# Patient Record
Sex: Female | Born: 1995 | Race: White | Hispanic: No | Marital: Single | Smoking: Never smoker
Health system: Southern US, Community
[De-identification: ages and names within clinical notes are randomized; demographics above are authoritative.]

## PROBLEM LIST (undated history)

## (undated) ENCOUNTER — Inpatient Hospital Stay (HOSPITAL_COMMUNITY): Payer: Self-pay

## (undated) DIAGNOSIS — J45909 Unspecified asthma, uncomplicated: Secondary | ICD-10-CM

## (undated) DIAGNOSIS — M419 Scoliosis, unspecified: Secondary | ICD-10-CM

---

## 2017-07-20 ENCOUNTER — Emergency Department (HOSPITAL_BASED_OUTPATIENT_CLINIC_OR_DEPARTMENT_OTHER)
Admission: EM | Admit: 2017-07-20 | Discharge: 2017-07-20 | Disposition: A | Discharge status: Home / Self Care | Payer: Medicaid - Out of State | Attending: Emergency Medicine | Admitting: Emergency Medicine

## 2017-07-20 ENCOUNTER — Encounter (HOSPITAL_BASED_OUTPATIENT_CLINIC_OR_DEPARTMENT_OTHER): Payer: Self-pay | Admitting: *Deleted

## 2017-07-20 ENCOUNTER — Emergency Department (HOSPITAL_BASED_OUTPATIENT_CLINIC_OR_DEPARTMENT_OTHER): Payer: Medicaid - Out of State

## 2017-07-20 DIAGNOSIS — J45909 Unspecified asthma, uncomplicated: Secondary | ICD-10-CM | POA: Diagnosis not present

## 2017-07-20 DIAGNOSIS — R110 Nausea: Secondary | ICD-10-CM | POA: Diagnosis not present

## 2017-07-20 DIAGNOSIS — Z3A01 Less than 8 weeks gestation of pregnancy: Secondary | ICD-10-CM | POA: Insufficient documentation

## 2017-07-20 DIAGNOSIS — Z3201 Encounter for pregnancy test, result positive: Secondary | ICD-10-CM | POA: Diagnosis not present

## 2017-07-20 DIAGNOSIS — O209 Hemorrhage in early pregnancy, unspecified: Secondary | ICD-10-CM | POA: Diagnosis not present

## 2017-07-20 DIAGNOSIS — O219 Vomiting of pregnancy, unspecified: Secondary | ICD-10-CM

## 2017-07-20 DIAGNOSIS — O9989 Other specified diseases and conditions complicating pregnancy, childbirth and the puerperium: Secondary | ICD-10-CM | POA: Diagnosis present

## 2017-07-20 HISTORY — DX: Unspecified asthma, uncomplicated: J45.909

## 2017-07-20 HISTORY — DX: Scoliosis, unspecified: M41.9

## 2017-07-20 LAB — URINALYSIS, ROUTINE W REFLEX MICROSCOPIC
Bilirubin Urine: NEGATIVE
GLUCOSE, UA: NEGATIVE mg/dL
Ketones, ur: 15 mg/dL — AB
Nitrite: NEGATIVE
PH: 8.5 — AB (ref 5.0–8.0)
Protein, ur: NEGATIVE mg/dL
SPECIFIC GRAVITY, URINE: 1.015 (ref 1.005–1.030)

## 2017-07-20 LAB — CBC
HCT: 41.1 % (ref 36.0–46.0)
Hemoglobin: 14.1 g/dL (ref 12.0–15.0)
MCH: 30 pg (ref 26.0–34.0)
MCHC: 34.3 g/dL (ref 30.0–36.0)
MCV: 87.4 fL (ref 78.0–100.0)
Platelets: 199 10*3/uL (ref 150–400)
RBC: 4.7 MIL/uL (ref 3.87–5.11)
RDW: 12.9 % (ref 11.5–15.5)
WBC: 8.3 10*3/uL (ref 4.0–10.5)

## 2017-07-20 LAB — URINALYSIS, MICROSCOPIC (REFLEX)

## 2017-07-20 LAB — PREGNANCY, URINE: Preg Test, Ur: POSITIVE — AB

## 2017-07-20 LAB — HCG, QUANTITATIVE, PREGNANCY: HCG, BETA CHAIN, QUANT, S: 7635 m[IU]/mL — AB (ref <5)

## 2017-07-20 MED ORDER — DOXYLAMINE SUCCINATE (SLEEP) 25 MG PO TABS: 25.0000 mg | ORAL_TABLET | Freq: Every evening | ORAL | 0 refills | Status: AC | PRN

## 2017-07-20 MED ORDER — VITAMIN B-6 25 MG PO TABS: 25.0000 mg | ORAL_TABLET | Freq: Every day | ORAL | 0 refills | Status: AC

## 2017-07-20 NOTE — ED Notes (Signed)
ED Provider at bedside. 

## 2017-07-20 NOTE — ED Provider Notes (Signed)
[facility] Provider [facility]   CSN: [id number] Arrival date & time: 07/20/17  1122     History   Chief Complaint Chief Complaint  Patient presents with  . Nausea    HPI Jody Martinez is a 21 y.o. female.  HPI   Patient is a 21 year old female with a history of asthma, scoliosis, and possible endometriosis (no tissue sample) presenting for nausea without vomiting in the setting of a positive pregnancy test at home. Patient wanted to be evaluated to ensure that she is pregnant and see if any further testing needed to be done as patient just moved to [place]. This is a planned pregnancy. Patient reports that she is nauseous throughout the day and it is exacerbated by certain foods. Patient reports she is not had any other symptoms of gastroenteritis such as vomiting or diarrhea. Patient denies fever, chills, lower abdominal pain, flank pain, dysuria, or urgency. Patient does report some spotting yesterday and today, with darker spots today. No clots. Patient has never been pregnant to her knowledge, has never had a miscarriage, has never had an ectopic pregnancy, and has never had an STI.   Past Medical History:  Diagnosis Date  . Asthma   . Scoliosis     There are no active problems to display for this patient.   History reviewed. No pertinent surgical history.  OB History    No data available       Home Medications    Prior to Admission medications   Not on File    Family History No family history on file.  Social History Social History  Substance Use Topics  . Smoking status: Never Smoker  . Smokeless tobacco: Never Used  . Alcohol use No     Allergies   Patient has no known allergies.   Review of Systems Review of Systems  Constitutional: Negative for chills and fever.  HENT: Negative for rhinorrhea.   Eyes: Negative for visual disturbance.  Respiratory: Negative for shortness of breath.   Cardiovascular: Negative for chest pain,  palpitations and leg swelling.  Gastrointestinal: Positive for constipation and nausea. Negative for abdominal pain, diarrhea and vomiting.  Genitourinary: Positive for vaginal bleeding. Negative for dysuria, flank pain and pelvic pain.  Musculoskeletal: Negative for back pain.  Skin: Negative for rash.  Neurological: Negative for dizziness.     Physical Exam Updated Vital Signs BP 121/72 (BP Location: Left Arm)   Pulse 84   Temp 99.3 F (37.4 C) (Oral)   Resp 18   Ht  (1.575 m)   Wt 70.3 kg (155 lb)   LMP 06/04/2017   SpO2 100%   BMI 28.35 kg/m   Physical Exam  Constitutional: She appears well-developed and well-nourished. No distress.  Sitting comfortably in bed.  HENT:  Head: Normocephalic and atraumatic.  Mouth/Throat: Oropharynx is clear and moist.  Eyes: Pupils are equal, round, and reactive to light. Conjunctivae and EOM are normal. Right eye exhibits no discharge. Left eye exhibits no discharge.  EOMs normal to gross examination.  Neck: Normal range of motion. Neck supple.  Cardiovascular: Normal rate, regular rhythm, S1 normal, S2 normal, normal heart sounds and intact distal pulses.   No murmur heard. No lower extremity edema.  Pulmonary/Chest: Effort normal and breath sounds normal. She has no wheezes. She has no rales.  Abdominal: Soft. She exhibits no distension. There is tenderness. There is no guarding.  Tenderness to deep palpation of the left lower quadrant.  Musculoskeletal: Normal range of  motion. She exhibits no edema or deformity.  Lymphadenopathy:    She has no cervical adenopathy.  Neurological: She is alert.  Cranial nerves grossly intact. Pt moves extremities without difficulty.  Skin: Skin is warm and dry. No rash noted. She is not diaphoretic. No erythema.  Psychiatric: She has a normal mood and affect. Her behavior is normal. Judgment and thought content normal.  Nursing note and vitals reviewed.    ED Treatments / Results   Labs (all labs ordered are listed, but only abnormal results are displayed) Labs Reviewed  URINALYSIS, ROUTINE W REFLEX MICROSCOPIC - Abnormal; Notable for the following:       Result Value   pH 8.5 (*)    Hgb urine dipstick LARGE (*)    Ketones, ur 15 (*)    Leukocytes, UA SMALL (*)    All other components within normal limits  PREGNANCY, URINE - Abnormal; Notable for the following:    Preg Test, Ur POSITIVE (*)    All other components within normal limits  URINALYSIS, MICROSCOPIC (REFLEX) - Abnormal; Notable for the following:    Bacteria, UA FEW (*)    Squamous Epithelial / LPF 6-30 (*)    All other components within normal limits    EKG  EKG Interpretation None       Radiology No results found.  Procedures Procedures (including critical care time)  Medications Ordered in ED Medications - No data to display   Initial Impression / Assessment and Plan / ED Course  I have reviewed the triage vital signs and the nursing notes.  Pertinent labs & imaging results that were available during my care of the patient were reviewed by me and considered in my medical decision making (see chart for details).  Clinical Course as of Jul 21 1223  Mon Jul 20, 2017  1222 Patient seen and evaluated. Discussed with patient the need to rule out possible ectopic pregnancy. Patient is in understanding and agreement with plan of care.  [AM]  1222 Patient case discussed with Dr. Azalia Bilis who is in agreement with workup.  [AM]    Clinical Course User Index [AM] Elisha Ponder, PA-C     Final Clinical Impressions(s) / ED Diagnoses   Final diagnoses:  None  MDM  Patient is a 21 year old female with a history of asthma, scoliosis, and possible endometriosis (no tissue sample) presenting for nausea without vomiting in the setting of a positive pregnancy test at home. Reassured patient and positive pregnancy result today. In the setting of vaginal bleeding over the past 48-72  hours, and left lower quadrant tenderness on exam, proceeded with rule out ectopic pregnancy workup. Patient's hCG appropriate for evaluating intrauterine location of pregnancy. Transvaginal ultrasound demonstrates gestational sac in the uterus, however no definable embryo noted. Subchorionic hemorrhage noted on TV US. Explained to patient that these results are difficult to interpret, as her pregnancy is so early. Emphasized follow-up in 72 hours with repeat hCG and vaginal ultrasound, and gave patient resources to follow up with [facility]. Return precautions given for any increasing vaginal bleeding, lower abdominal pain or cramping, or severe nausea or vomiting that affects nutrition. Doxylamine and vitamin B6 prescribed today. Patient is in understanding and agrees with the plan of care.   This is a supervised visit with Dr. Azalia Bilis. Evaluation, management, and discharge planning discussed with this attending physician.  New Prescriptions New Prescriptions   No medications on file     Elisha Ponder, [place]  07/20/17 0347    Azalia Bilis, MD 07/21/17 1606

## 2017-07-20 NOTE — ED Triage Notes (Signed)
Nausea. Missed menses. Had a positive home pregnancy test. Wants to be sure the test is correct.

## 2017-07-20 NOTE — Discharge Instructions (Addendum)
Please see the information and instructions below regarding your visit.  Your diagnoses today include:  1. Nausea/vomiting in pregnancy    You were seen today for an early pregnancy. You are between 5 and [redacted] weeks along in your pregnancy.  Tests performed today include: See side panel of your discharge paperwork for testing performed today. Vital signs are listed at the bottom of these instructions.  -Transvaginal ultrasound -Blood counts -Quantitative beta-hcg (pregnancy hormone)  Medications prescribed:    Take any prescribed medications only as prescribed, and any over the counter medications only as directed on the packaging.  1. Doxylamine/. This is a medication that is a cousin of Benadryl. This medication can make you sleepy, so please take at night to begin. 2. Vitamin B6. You may take this medication every 4-6 hours to help with nausea.  Some women find that injure also helps with nausea and vomiting of early pregnancy.  Home care instructions:  Please follow any educational materials contained in this packet.   Follow-up instructions: Please follow up with [facility] using the number listed in this paperwork so that you can see them in 72 hours for reevaluation of your hormones and ultrasound.  Return instructions:  Please return to the Emergency Department if you experience worsening symptoms.  Please return for any increased vaginal bleeding such that you are bleeding through 1 pad an hour for at least 6 hours, nausea and vomiting that prevents she from keeping anything down, lower belly to lower back pain, or any other concerns about your pregnancy. Please return if you have any other emergent concerns.   Your vital signs today were: BP 114/72 (BP Location: Right Arm)    Pulse 93    Temp 99.3 F (37.4 C) (Oral)    Resp 18    Ht  (1.575 m)    Wt 70.3 kg (155 lb)    LMP 06/04/2017 (Exact Date)    SpO2 100%    BMI 28.35 kg/m  If your  blood pressure (BP) was elevated on multiple readings during this visit above 130 for the top number or above 80 for the bottom number, please have this repeated by your primary care provider within one month. --------------  Thank you for allowing [place] to participate in your care today.

## 2017-07-24 ENCOUNTER — Encounter (HOSPITAL_COMMUNITY): Payer: Self-pay | Admitting: *Deleted

## 2017-07-24 ENCOUNTER — Emergency Department (HOSPITAL_BASED_OUTPATIENT_CLINIC_OR_DEPARTMENT_OTHER)
Admission: EM | Admit: 2017-07-24 | Discharge: 2017-07-24 | Discharge status: Against Medical Advice | Payer: Medicaid - Out of State | Source: Home / Self Care

## 2017-07-24 ENCOUNTER — Encounter (HOSPITAL_BASED_OUTPATIENT_CLINIC_OR_DEPARTMENT_OTHER): Payer: Self-pay | Admitting: Emergency Medicine

## 2017-07-24 ENCOUNTER — Inpatient Hospital Stay (HOSPITAL_COMMUNITY)
Admission: AD | Admit: 2017-07-24 | Discharge: 2017-07-24 | Disposition: A | Discharge status: Home / Self Care | Payer: Medicaid - Out of State | Source: Ambulatory Visit | Attending: Obstetrics & Gynecology | Admitting: Obstetrics & Gynecology

## 2017-07-24 DIAGNOSIS — R109 Unspecified abdominal pain: Secondary | ICD-10-CM

## 2017-07-24 DIAGNOSIS — O468X1 Other antepartum hemorrhage, first trimester: Secondary | ICD-10-CM

## 2017-07-24 DIAGNOSIS — O209 Hemorrhage in early pregnancy, unspecified: Secondary | ICD-10-CM | POA: Diagnosis not present

## 2017-07-24 DIAGNOSIS — Z5321 Procedure and treatment not carried out due to patient leaving prior to being seen by health care provider: Secondary | ICD-10-CM | POA: Insufficient documentation

## 2017-07-24 DIAGNOSIS — N939 Abnormal uterine and vaginal bleeding, unspecified: Secondary | ICD-10-CM | POA: Diagnosis present

## 2017-07-24 DIAGNOSIS — Z3A01 Less than 8 weeks gestation of pregnancy: Secondary | ICD-10-CM | POA: Insufficient documentation

## 2017-07-24 DIAGNOSIS — O418X10 Other specified disorders of amniotic fluid and membranes, first trimester, not applicable or unspecified: Secondary | ICD-10-CM | POA: Diagnosis not present

## 2017-07-24 LAB — WET PREP, GENITAL
Clue Cells Wet Prep HPF POC: NONE SEEN
Sperm: NONE SEEN
TRICH WET PREP: NONE SEEN
Yeast Wet Prep HPF POC: NONE SEEN

## 2017-07-24 LAB — CBC
HEMATOCRIT: 39.9 % (ref 36.0–46.0)
HEMOGLOBIN: 13.7 g/dL (ref 12.0–15.0)
MCH: 30.5 pg (ref 26.0–34.0)
MCHC: 34.3 g/dL (ref 30.0–36.0)
MCV: 88.9 fL (ref 78.0–100.0)
Platelets: 195 10*3/uL (ref 150–400)
RBC: 4.49 MIL/uL (ref 3.87–5.11)
RDW: 13.2 % (ref 11.5–15.5)
WBC: 7.8 10*3/uL (ref 4.0–10.5)

## 2017-07-24 LAB — URINALYSIS, ROUTINE W REFLEX MICROSCOPIC
BILIRUBIN URINE: NEGATIVE
Glucose, UA: NEGATIVE mg/dL
HGB URINE DIPSTICK: NEGATIVE
Ketones, ur: 15 mg/dL — AB
Leukocytes, UA: NEGATIVE
Nitrite: NEGATIVE
PH: 7.5 (ref 5.0–8.0)
Protein, ur: NEGATIVE mg/dL
SPECIFIC GRAVITY, URINE: 1.02 (ref 1.005–1.030)

## 2017-07-24 LAB — ABO/RH: ABO/RH(D): O POS

## 2017-07-24 LAB — HCG, QUANTITATIVE, PREGNANCY: hCG, Beta Chain, Quant, S: 20876 m[IU]/mL — ABNORMAL HIGH (ref <5)

## 2017-07-24 MED ORDER — ACETAMINOPHEN 325 MG PO TABS
650.0000 mg | ORAL_TABLET | Freq: Once | ORAL | Status: AC
  Administered 2017-07-24: 650 mg via ORAL
  Filled 2017-07-24: qty 2

## 2017-07-24 NOTE — MAU Note (Signed)
Urine in lab 

## 2017-07-24 NOTE — MAU Note (Signed)
Cramping and spotting yesterday.   Just cramping today.

## 2017-07-24 NOTE — Discharge Instructions (Signed)
First Trimester of Pregnancy °The first trimester of pregnancy is from week 1 until the end of week 13 (months 1 through 3). A week after a sperm fertilizes an egg, the egg will implant on the wall of the uterus. This embryo will begin to develop into a baby. Genes from you and your partner will form the baby. The female genes will determine whether the baby will be a boy or a girl. At 6-8 weeks, the eyes and face will be formed, and the heartbeat can be seen on ultrasound. At the end of 12 weeks, all the baby's organs will be formed. °Now that you are pregnant, you will want to do everything you can to have a healthy baby. Two of the most important things are to get good prenatal care and to follow your health care provider's instructions. Prenatal care is all the medical care you receive before the baby's birth. This care will help prevent, find, and treat any problems during the pregnancy and childbirth. °Body changes during your first trimester °Your body goes through many changes during pregnancy. The changes vary from woman to woman. °· You may gain or lose a couple of pounds at first. °· You may feel sick to your stomach (nauseous) and you may throw up (vomit). If the vomiting is uncontrollable, call your health care provider. °· You may tire easily. °· You may develop headaches that can be relieved by medicines. All medicines should be approved by your health care provider. °· You may urinate more often. Painful urination may mean you have a bladder infection. °· You may develop heartburn as a result of your pregnancy. °· You may develop constipation because certain hormones are causing the muscles that push stool through your intestines to slow down. °· You may develop hemorrhoids or swollen veins (varicose veins). °· Your breasts may begin to grow larger and become tender. Your nipples may stick out more, and the tissue that surrounds them (areola) may become darker. °· Your gums may bleed and may be  sensitive to brushing and flossing. °· Dark spots or blotches (chloasma, mask of pregnancy) may develop on your face. This will likely fade after the baby is born. °· Your menstrual periods will stop. °· You may have a loss of appetite. °· You may develop cravings for certain kinds of food. °· You may have changes in your emotions from day to day, such as being excited to be pregnant or being concerned that something may go wrong with the pregnancy and baby. °· You may have more vivid and strange dreams. °· You may have changes in your hair. These can include thickening of your hair, rapid growth, and changes in texture. Some women also have hair loss during or after pregnancy, or hair that feels dry or thin. Your hair will most likely return to normal after your baby is born. ° °What to expect at prenatal visits °During a routine prenatal visit: °· You will be weighed to make sure you and the baby are growing normally. °· Your blood pressure will be taken. °· Your abdomen will be measured to track your baby's growth. °· The fetal heartbeat will be listened to between weeks 10 and 14 of your pregnancy. °· Test results from any previous visits will be discussed. ° °Your health care provider may ask you: °· How you are feeling. °· If you are feeling the baby move. °· If you have had any abnormal symptoms, such as leaking fluid, bleeding, severe headaches,   or abdominal cramping. °· If you are using any tobacco products, including cigarettes, chewing tobacco, and electronic cigarettes. °· If you have any questions. ° °Other tests that may be performed during your first trimester include: °· Blood tests to find your blood type and to check for the presence of any previous infections. The tests will also be used to check for low iron levels (anemia) and protein on red blood cells (Rh antibodies). Depending on your risk factors, or if you previously had diabetes during pregnancy, you may have tests to check for high blood  sugar that affects pregnant women (gestational diabetes). °· Urine tests to check for infections, diabetes, or protein in the urine. °· An ultrasound to confirm the proper growth and development of the baby. °· Fetal screens for spinal cord problems (spina bifida) and Down syndrome. °· HIV (human immunodeficiency virus) testing. Routine prenatal testing includes screening for HIV, unless you choose not to have this test. °· You may need other tests to make sure you and the baby are doing well. ° °Follow these instructions at home: °Medicines °· Follow your health care provider's instructions regarding medicine use. Specific medicines may be either safe or unsafe to take during pregnancy. °· Take a prenatal vitamin that contains at least 600 micrograms (mcg) of folic acid. °· If you develop constipation, try taking a stool softener if your health care provider approves. °Eating and drinking °· Eat a balanced diet that includes fresh fruits and vegetables, whole grains, good sources of protein such as meat, eggs, or tofu, and low-fat dairy. Your health care provider will help you determine the amount of weight gain that is right for you. °· Avoid raw meat and uncooked cheese. These carry germs that can cause birth defects in the baby. °· Eating four or five small meals rather than three large meals a day may help relieve nausea and vomiting. If you start to feel nauseous, eating a few soda crackers can be helpful. Drinking liquids between meals, instead of during meals, also seems to help ease nausea and vomiting. °· Limit foods that are high in fat and processed sugars, such as fried and sweet foods. °· To prevent constipation: °? Eat foods that are high in fiber, such as fresh fruits and vegetables, whole grains, and beans. °? Drink enough fluid to keep your urine clear or pale yellow. °Activity °· Exercise only as directed by your health care provider. Most women can continue their usual exercise routine during  pregnancy. Try to exercise for 30 minutes at least 5 days a week. Exercising will help you: °? Control your weight. °? Stay in shape. °? Be prepared for labor and delivery. °· Experiencing pain or cramping in the lower abdomen or lower back is a good sign that you should stop exercising. Check with your health care provider before continuing with normal exercises. °· Try to avoid standing for long periods of time. Move your legs often if you must stand in one place for a long time. °· Avoid heavy lifting. °· Wear low-heeled shoes and practice good posture. °· You may continue to have sex unless your health care provider tells you not to. °Relieving pain and discomfort °· Wear a good support bra to relieve breast tenderness. °· Take warm sitz baths to soothe any pain or discomfort caused by hemorrhoids. Use hemorrhoid cream if your health care provider approves. °· Rest with your legs elevated if you have leg cramps or low back pain. °· If you develop   varicose veins in your legs, wear support hose. Elevate your feet for 15 minutes, 3-4 times a day. Limit salt in your diet. Prenatal care  Schedule your prenatal visits by the twelfth week of pregnancy. They are usually scheduled monthly at first, then more often in the last 2 months before delivery.  Write down your questions. Take them to your prenatal visits.  Keep all your prenatal visits as told by your health care provider. This is important. Safety  Wear your seat belt at all times when driving.  Make a list of emergency phone numbers, including numbers for family, friends, the hospital, and police and fire departments. General instructions  Ask your health care provider for a referral to a local prenatal education class. Begin classes no later than the beginning of month 6 of your pregnancy.  Ask for help if you have counseling or nutritional needs during pregnancy. Your health care provider can offer advice or refer you to specialists for help  with various needs.  Do not use hot tubs, steam rooms, or saunas.  Do not douche or use tampons or scented sanitary pads.  Do not cross your legs for long periods of time.  Avoid cat litter boxes and soil used by cats. These carry germs that can cause birth defects in the baby and possibly loss of the fetus by miscarriage or stillbirth.  Avoid all smoking, herbs, alcohol, and medicines not prescribed by your health care provider. Chemicals in these products affect the formation and growth of the baby.  Do not use any products that contain nicotine or tobacco, such as cigarettes and e-cigarettes. If you need help quitting, ask your health care provider. You may receive counseling support and other resources to help you quit.  Schedule a dentist appointment. At home, brush your teeth with a soft toothbrush and be gentle when you floss. Contact a health care provider if:  You have dizziness.  You have mild pelvic cramps, pelvic pressure, or nagging pain in the abdominal area.  You have persistent nausea, vomiting, or diarrhea.  You have a bad smelling vaginal discharge.  You have pain when you urinate.  You notice increased swelling in your face, hands, legs, or ankles.  You are exposed to fifth disease or chickenpox.  You are exposed to [place] measles (rubella) and have never had it. Get help right away if:  You have a fever.  You are leaking fluid from your vagina.  You have spotting or bleeding from your vagina.  You have severe abdominal cramping or pain.  You have rapid weight gain or loss.  You vomit blood or material that looks like coffee grounds.  You develop a severe headache.  You have shortness of breath.  You have any kind of trauma, such as from a fall or a car accident. Summary  The first trimester of pregnancy is from week 1 until the end of week 13 (months 1 through 3).  Your body goes through many changes during pregnancy. The changes vary from  woman to woman.  You will have routine prenatal visits. During those visits, your health care provider will examine you, discuss any test results you may have, and talk with you about how you are feeling. This information is not intended to replace advice given to you by your health care provider. Make sure you discuss any questions you have with your health care provider. Document Released: 10/07/2001 Document Revised: 09/24/2016 Document Reviewed: 09/24/2016 Elsevier Interactive Patient Education  2017 [employer] ---------------------------------------------------------------------------------  SAFE MEDICATIONS IN PREGNANCY  Acne:  Benzoyl Peroxide  Salicylic Acid   Pain/Headache:  Tylenol: 2 regular strength every 4 hours OR        2 Extra strength every 6 hours   Colds/Coughs/Allergies:  Benadryl (alcohol free) 25 mg every 6 hours as needed  Breath right strips  Claritin  Cepacol throat lozenges  Chloraseptic throat spray  Cold-Eeze- up to three times per day  Cough drops, alcohol free  Flonase (by prescription only)  Guaifenesin  Mucinex  Robitussin DM (plain only, alcohol free)  Saline nasal spray/drops  Sudafed (pseudoephedrine) & Actifed * use only after [redacted] weeks gestation and if you do not have high blood pressure  Tylenol  Vicks Vaporub  Zinc lozenges  Zyrtec   Constipation:  Colace  Ducolax suppositories  Fleet enema  Glycerin suppositories  Metamucil  Milk of magnesia  Miralax  Senokot  Smooth move tea   Diarrhea:  Kaopectate  Imodium A-D   *NO pepto Bismol   Hemorrhoids:  Anusol  Anusol HC  Preparation H  Tucks   Indigestion:  Tums  Maalox  Mylanta  Zantac  Pepcid   Insomnia:  Benadryl (alcohol free)  every 6 hours as needed  Tylenol PM  Unisom, no Gelcaps   Leg Cramps:  Tums  MagGel   Nausea/Vomiting:  Bonine  Dramamine  Emetrol  Ginger extract  Sea bands  Meclizine  Nausea medication to take during  pregnancy:  Unisom (doxylamine succinate 25 mg tablets) Take one tablet daily at bedtime. If symptoms are not adequately controlled, the dose can be increased to a maximum recommended dose of two tablets daily (1/2 tablet in the morning, 1/2 tablet mid-afternoon and one at bedtime).  Vitamin B6  tablets. Take one tablet twice a day (up to 200 mg per day).   Skin Rashes:  Aveeno products  Benadryl cream or  every 6 hours as needed  Calamine Lotion  1% cortisone cream   Yeast infection:  Gyne-lotrimin 7  Monistat 7   **If taking multiple medications, please check labels to avoid duplicating the same active ingredients  **take medication as directed on the label  ** Do not exceed 4000 mg of tylenol in 24 hours  **Do not take medications that contain aspirin or ibuprofen

## 2017-07-24 NOTE — MAU Note (Signed)
Pt is  G2P0 at 6 weeks, c/o spotting last night, dark red blood and cramping that has continued into today.  Cramping has gotten worse today.  Labs and US done on Monday to confirm pregnancy.

## 2017-07-24 NOTE — ED Triage Notes (Signed)
Pt is [redacted] weeks pregnant. Pt seen here Monday. Pt c/o abd cramping. Pt was referred to women's but states when she calls she cannot get anyone to answer the phone. Pt denies vaginal bleeding or discharge.

## 2017-07-24 NOTE — MAU Provider Note (Signed)
History     CSN: [id number] Arrival date and time: 07/24/17 1600  First Provider Initiated Contact with Patient 07/24/17 1714      Chief Complaint  Patient presents with  . Vaginal Bleeding  . Abdominal Pain    HPI: Jody Martinez is a 21 y.o. G2P0 with IUP at [redacted]w[redacted]d who presents to maternity admissions d/t abdominal cramping and vaginal spotting. She was seen at [facility] ED 4 days ago for the same. Has ultraound and lab working confirming pregnancy. She reports that she had spotting again last night, and she is having lower abdominal cramping. Has not tried taking anything for her pain. Denies fever, chills, abnormal vaginal discharge, dysuria, urinary frequency, or vomiting, but endorses nausea.   Past obstetric history: OB History  Gravida Para Term Preterm AB Living  2            SAB TAB Ectopic Multiple Live Births               # Outcome Date GA Lbr Len/2nd Weight Sex Delivery Anes PTL Lv  2 Current           1 Gravida               Past Medical History:  Diagnosis Date  . Asthma   . Scoliosis     History reviewed. No pertinent surgical history.  History reviewed. No pertinent family history.  Social History  Substance Use Topics  . Smoking status: Never Smoker  . Smokeless tobacco: Never Used  . Alcohol use No    Allergies: No Known Allergies  Prescriptions Prior to Admission  Medication Sig Dispense Refill Last Dose  . doxylamine, Sleep, (UNISOM) 25 MG tablet Take 1 tablet (25 mg total) by mouth at bedtime as needed. 30 tablet 0   . vitamin B-6 (PYRIDOXINE) 25 MG tablet Take 1 tablet (25 mg total) by mouth daily. 60 tablet 0     Review of Systems - Negative except for what is mentioned in HPI.  Physical Exam   Blood pressure (!) 112/56, pulse 81, temperature 98.8 F (37.1 C), temperature source Oral, resp. rate 18, weight 157 lb 12 oz (71.6 kg), last menstrual period 06/04/2017, SpO2 100 %.  Constitutional: Well-developed, well-nourished  female in no acute distress.  Cardiovascular: normal rate, regular rythm Respiratory: normal effort, lungs CTAB.  GI: Abd soft, non-tender,  Non-distended, no rebound or guarding.  GU: Neg CVAT  Pelvic: patient declined speculum exam. Bind swabs for cultures collected, with no blood noted coming from vaginal introitus.  MSK: Extremities nontender, no edema, normal ROM Neurologic: Alert and oriented x 4. Psych: Normal mood and affect  MAU Course  Procedures  MDM. Patient hemodynamically stable. Exam reassuring. Vaginal cultures collected. Review of EHR shows: beta-quant 7,635 on 07/20/17, and U/S showing intrauterine gestational sac w/ yolk sac, measuring [redacted]w[redacted]d, and small subchorionic hemorrhage.  T&S, and repeat CBC and beta-quant ordered. Results for orders placed or performed during the hospital encounter of 07/24/17  Wet prep, genital  Result Value Ref Range   Yeast Wet Prep HPF POC NONE SEEN NONE SEEN   Trich, Wet Prep NONE SEEN NONE SEEN   Clue Cells Wet Prep HPF POC NONE SEEN NONE SEEN   WBC, Wet Prep HPF POC MANY (A) NONE SEEN   Sperm NONE SEEN   CBC  Result Value Ref Range   WBC 7.8 4.0 - 10.5 K/uL   RBC 4.49 3.87 - 5.11 MIL/uL   Hemoglobin 13.7  12.0 - 15.0 g/dL   HCT 16.1 09.6 - 04.5 %   MCV 88.9 78.0 - 100.0 fL   MCH 30.5 26.0 - 34.0 pg   MCHC 34.3 30.0 - 36.0 g/dL   RDW 40.9 81.1 - 91.4 %   Platelets 195 150 - 400 K/uL  hCG, quantitative, pregnancy  Result Value Ref Range   hCG, Beta Chain, Quant, S 20,876 (H) <5 mIU/mL  ABO/Rh  Result Value Ref Range   ABO/RH(D) O POS    --Beta-quant 78,295 from 7,635 --Hgb stable --Rh positive  Assessment and Plan  Assessment: 1. Subchorionic hemorrhage of placenta in first trimester, single or unspecified fetus     Plan: --Discussed results above with patient, and discussed return precautions --Discharge home in stable condition.  --[facility] messaged to schedule initial PNV as per pt request --List of meds safe in  pregnancy given to patient.   Degele, Kandra Nicolas, MD 07/24/2017 5:14 PM

## 2017-07-24 NOTE — ED Notes (Signed)
Pt to desk, states she is leaving and going to another hospital. Advised pt she was next to go back and room was being cleaned. Pt states she already had her ride on the way and needed to leave. Pt encouraged to stay, declined. Informed pt she could return at any time. Pt alert and ambulatory in lobby. Walked from dept without difficulty with friend

## 2017-07-27 LAB — GC/CHLAMYDIA PROBE AMP (~~LOC~~) NOT AT ARMC
Chlamydia: NEGATIVE
NEISSERIA GONORRHEA: NEGATIVE

## 2017-08-31 ENCOUNTER — Encounter: Payer: Self-pay | Admitting: Obstetrics and Gynecology

## 2017-08-31 ENCOUNTER — Encounter: Payer: Medicaid Other | Admitting: Obstetrics and Gynecology

## 2017-08-31 NOTE — Progress Notes (Signed)
Patient did not show for her new OB appt 08/31/17. She will be contacted to reschedule.   Baldemar LenisK. Meryl Soumya Colson, M.D. Attending Obstetrician & Gynecologist, [facility], [facility]

## 2018-06-03 ENCOUNTER — Encounter (HOSPITAL_COMMUNITY): Payer: Self-pay

## 2019-04-21 IMAGING — US US OB TRANSVAGINAL
1 series · 14 of 28 positions shown · non-contrast
Comparison: None.

CLINICAL DATA: 21-year-old pregnant female with vaginal bleeding
for 1 day with pelvic pain. Positive urine pregnancy test. Estimated
gestational age of 6 weeks 4 days by LMP.

EXAM:
OBSTETRIC <14 WK US AND TRANSVAGINAL OB US
TECHNIQUE: Both transabdominal and transvaginal ultrasound examinations were
performed for complete evaluation of the gestation as well as the
maternal uterus, adnexal regions, and pelvic cul-de-sac.
Transvaginal technique was performed to assess early pregnancy.

[Series 1: us ob transvaginal · 0.18mm/px · 14 of 104 slices shown]
[im 4/104]
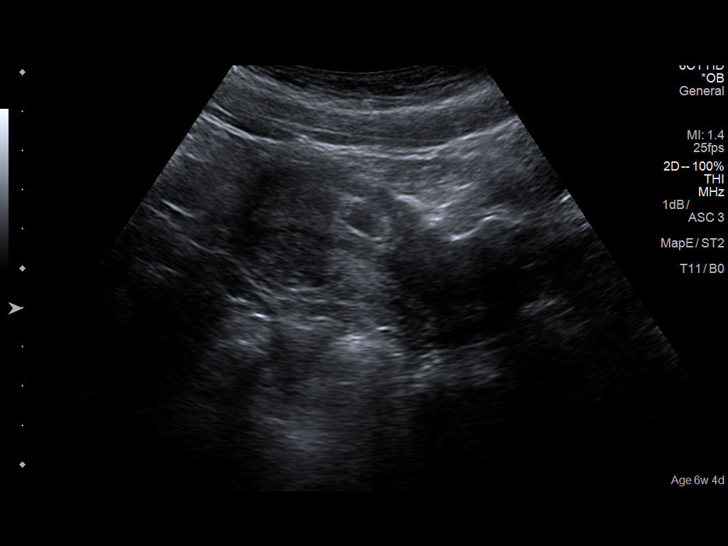
[im 12/104]
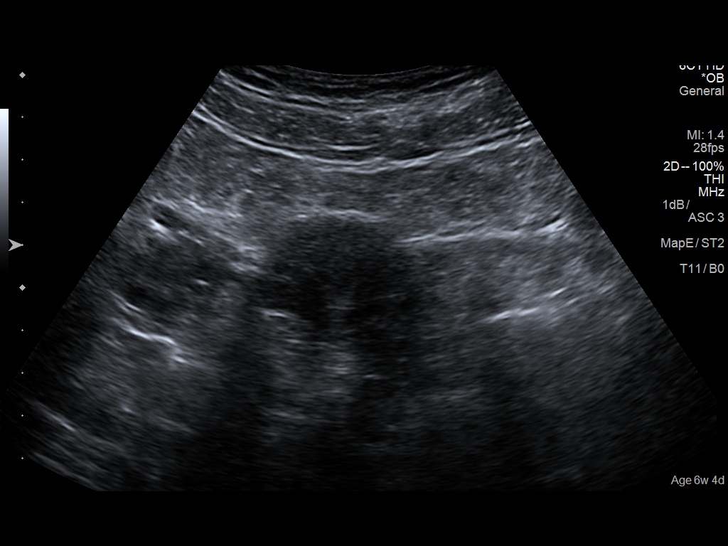
[im 20/104]
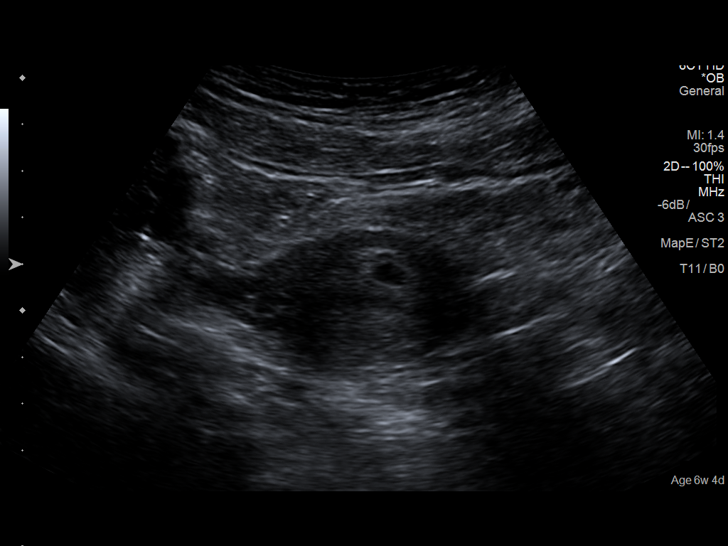
[im 27/104]
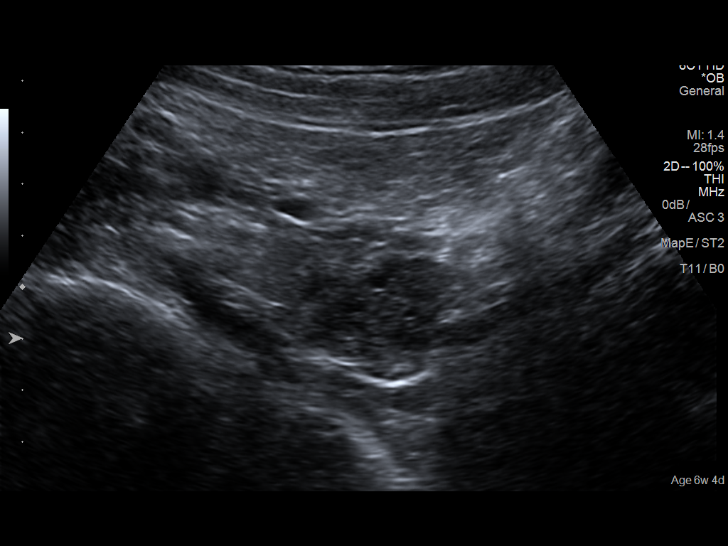
[im 35/104]
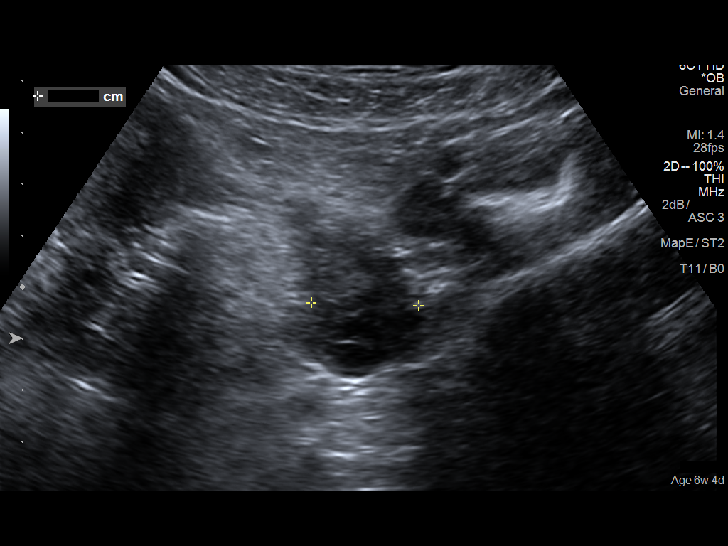
[im 42/104]
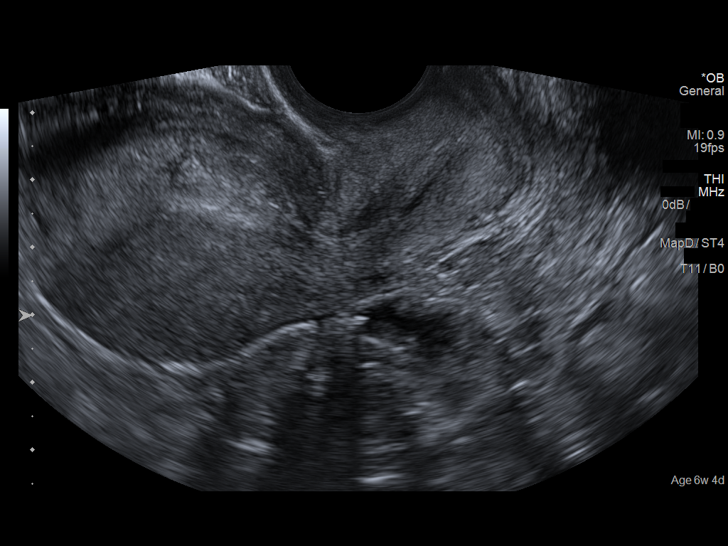
[im 50/104]
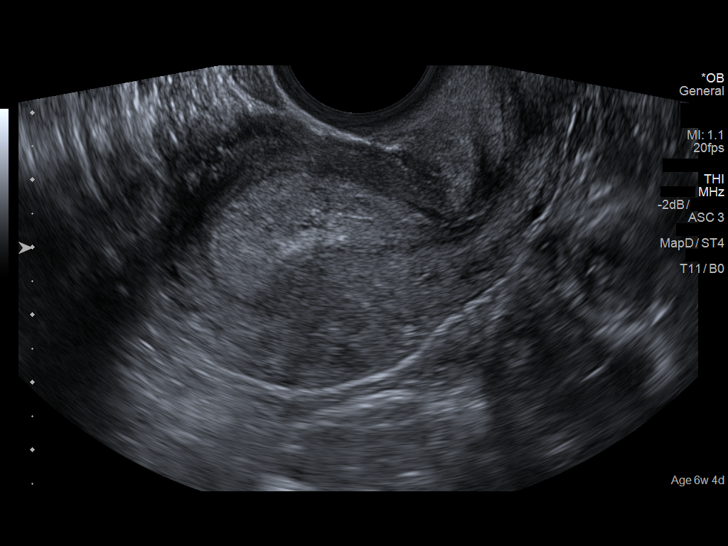
[im 58/104]
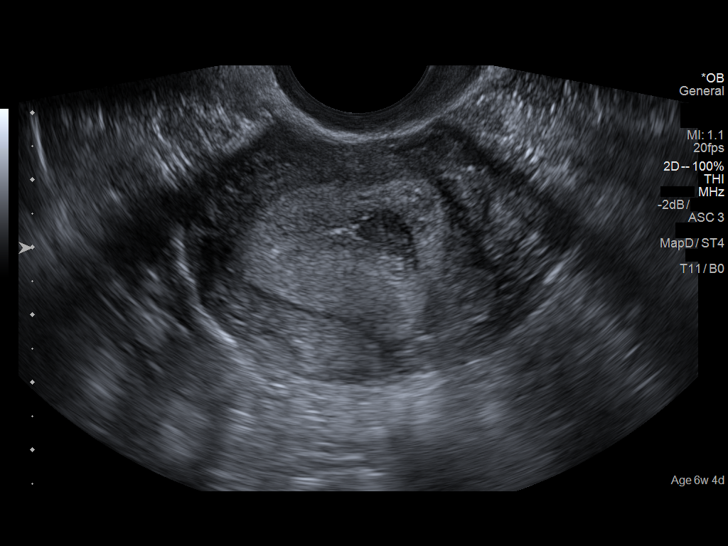
[im 65/104]
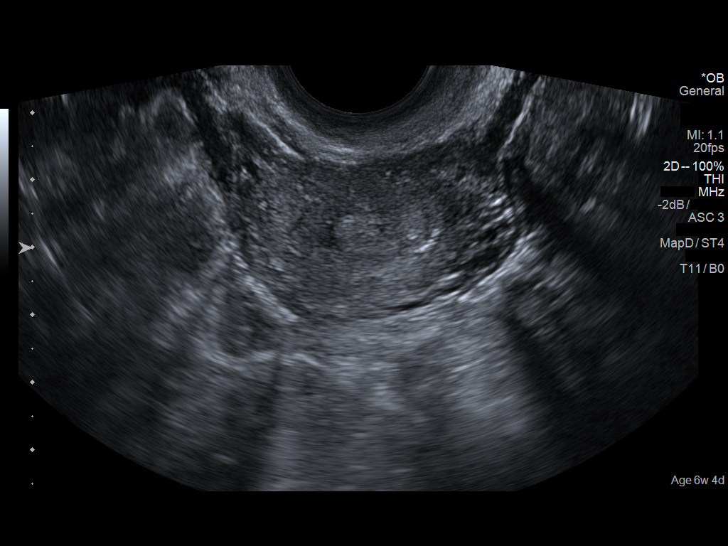
[im 73/104]
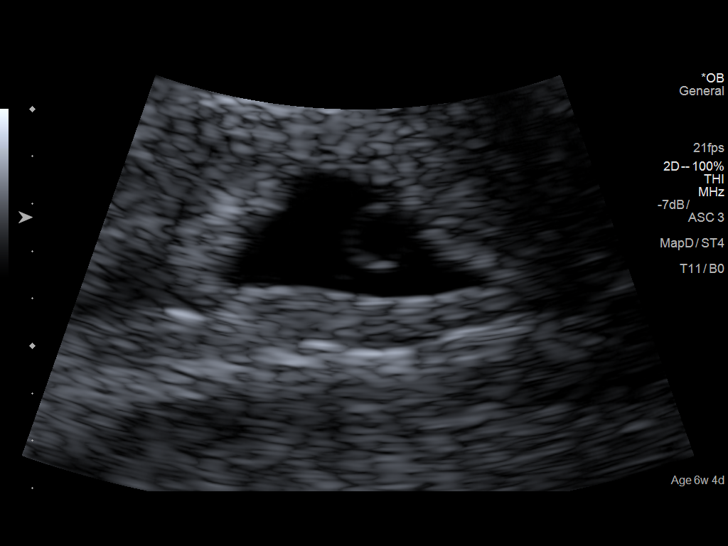
[im 81/104]
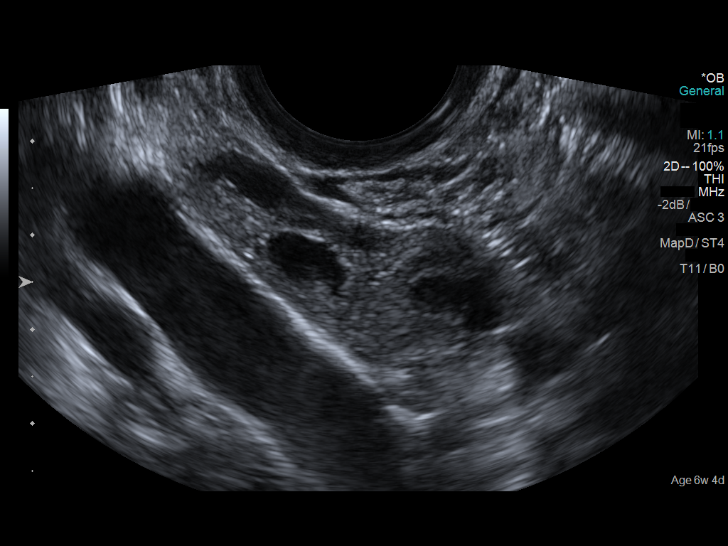
[im 88/104]
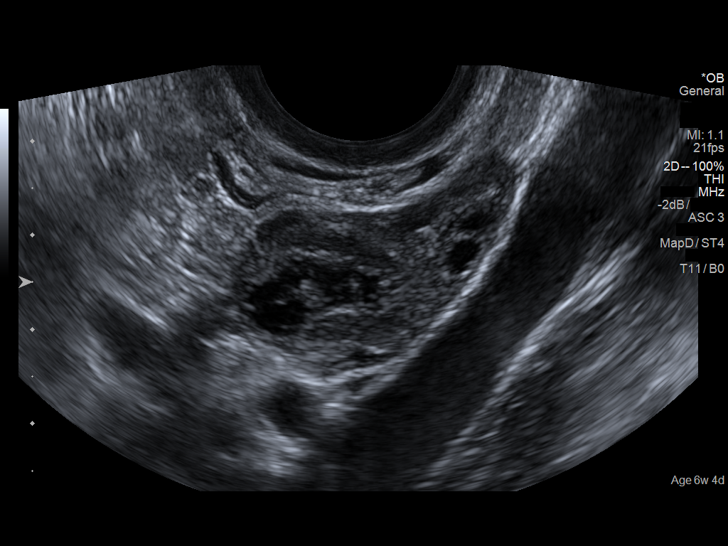
[im 96/104]
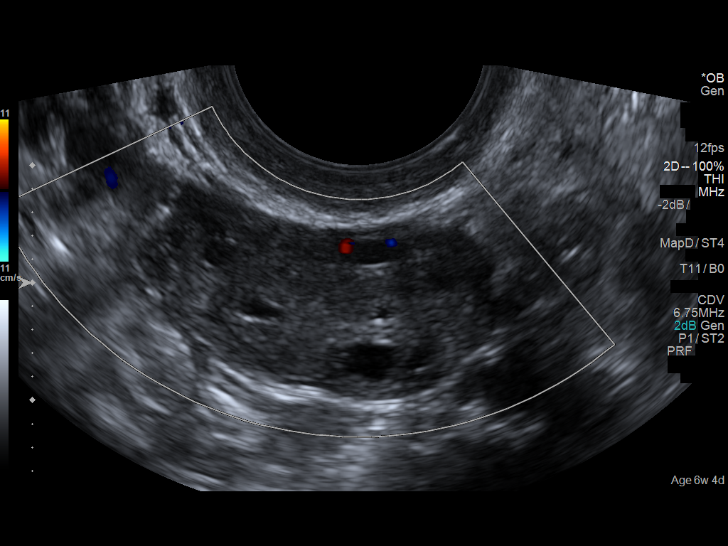
[im 104/104]
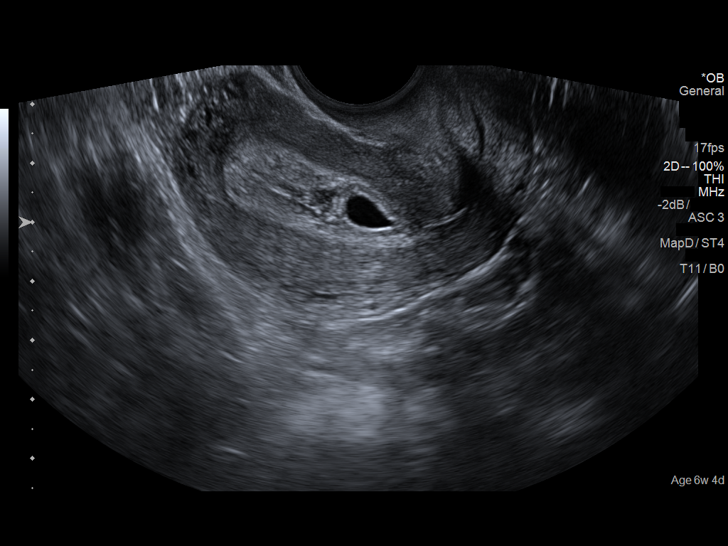

[14 of 28 positions shown; findings below may reference images not displayed]

FINDINGS: Intrauterine gestational sac: Single, present in the lower uterus

Yolk sac:  Visualized.

Embryo:  Not Visualized.

MSD: 7.7  mm   5 w   4  d

Subchorionic hemorrhage:  A small subchorionic hemorrhage is noted.

Maternal uterus/adnexae: The ovaries are unremarkable. No free fluid
or solid adnexal mass.
IMPRESSION: 1. Single intrauterine gestational sac with yolk sac in the lower
uterus. No evidence of embryo at this time.
2. Small subchronic hemorrhage
